# Patient Record
Sex: Male | Born: 2005 | Race: White | Hispanic: No | Marital: Single | Smoking: Never smoker
Health system: Southern US, Community
[De-identification: ages and names within clinical notes are randomized; demographics above are authoritative.]

---
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
## 2023-10-15 IMAGING — DX DG SCOLIOSIS EVAL COMPLETE SPINE 2-3V
2 series · 8 of 8 positions shown · non-contrast
Comparison: None.

CLINICAL DATA: Lordosis of lumbar region.

EXAM:
DG SCOLIOSIS EVAL COMPLETE SPINE 2-3V

[Series 1: whole body ap · 0.14mm/px · 4 of 4 slices shown]
[im 1/4]
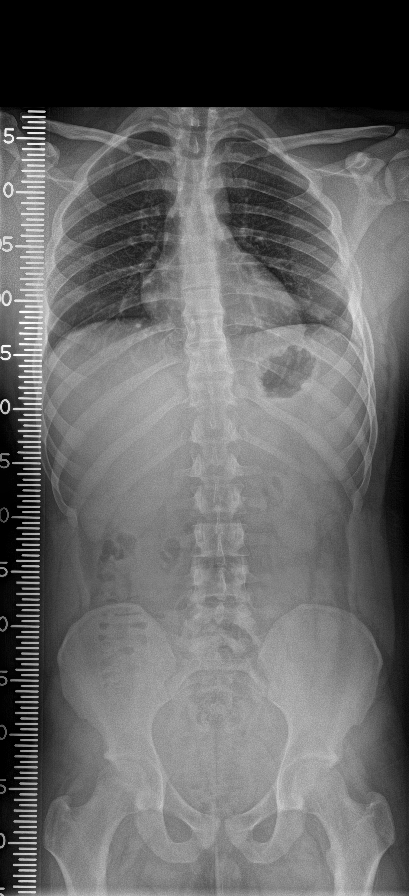
[im 2/4]
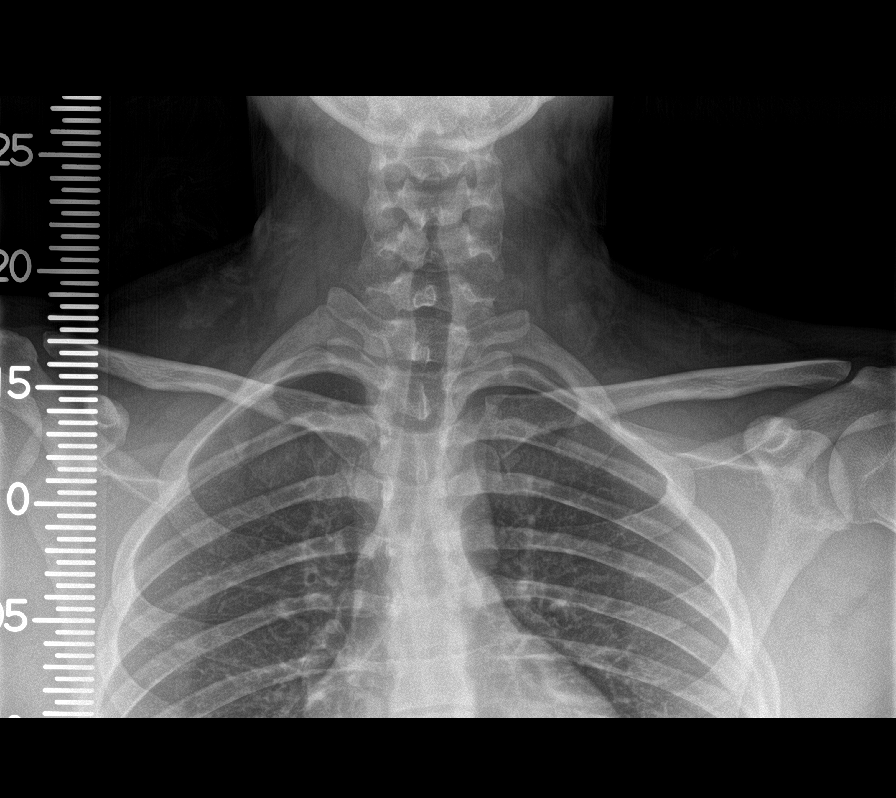
[im 3/4]
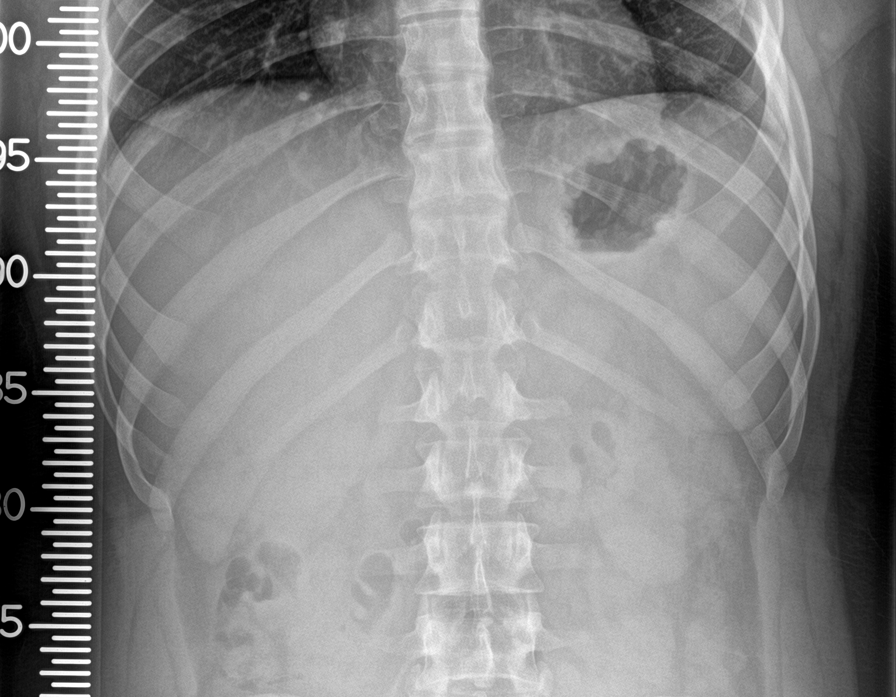
[im 4/4]
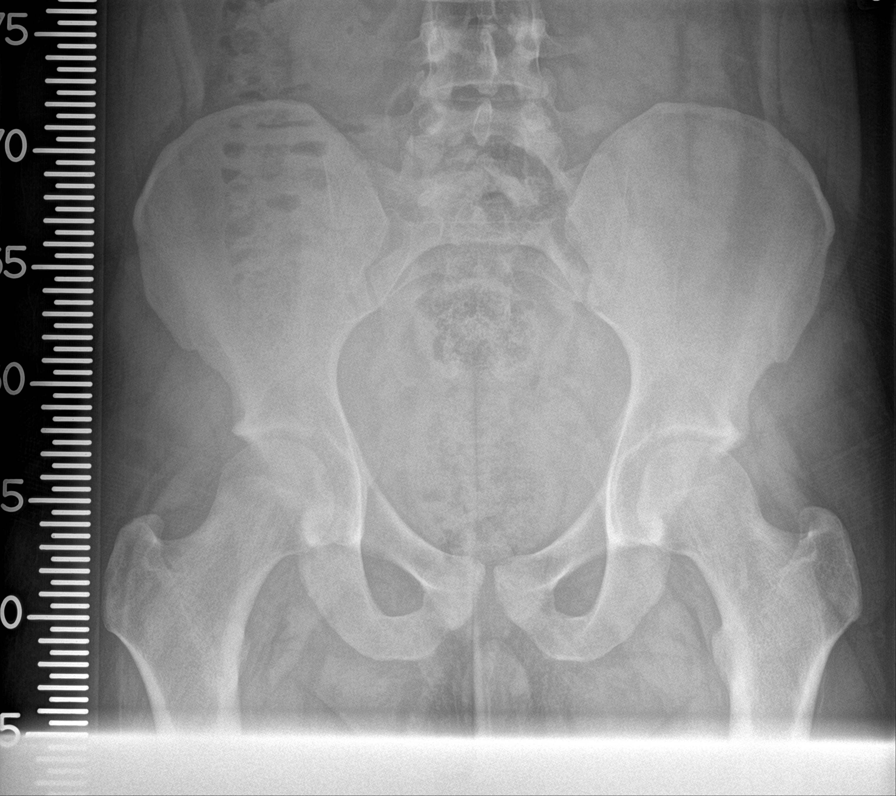

[Series 2: whole body lat · 0.14mm/px · 4 of 4 slices shown]
[im 1/4]
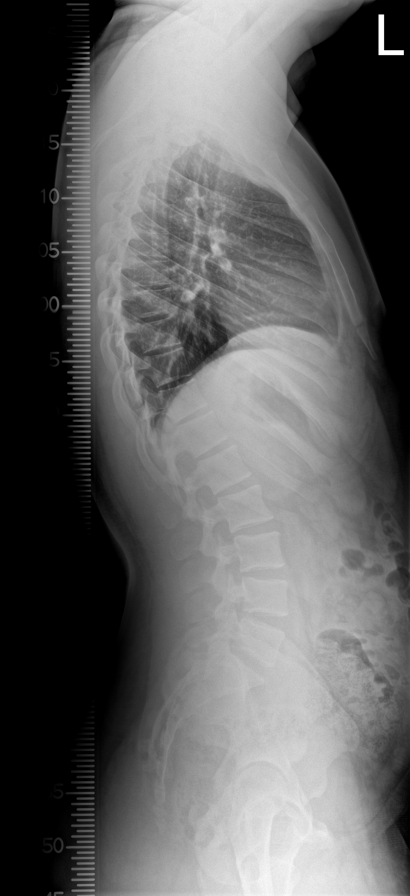
[im 2/4]
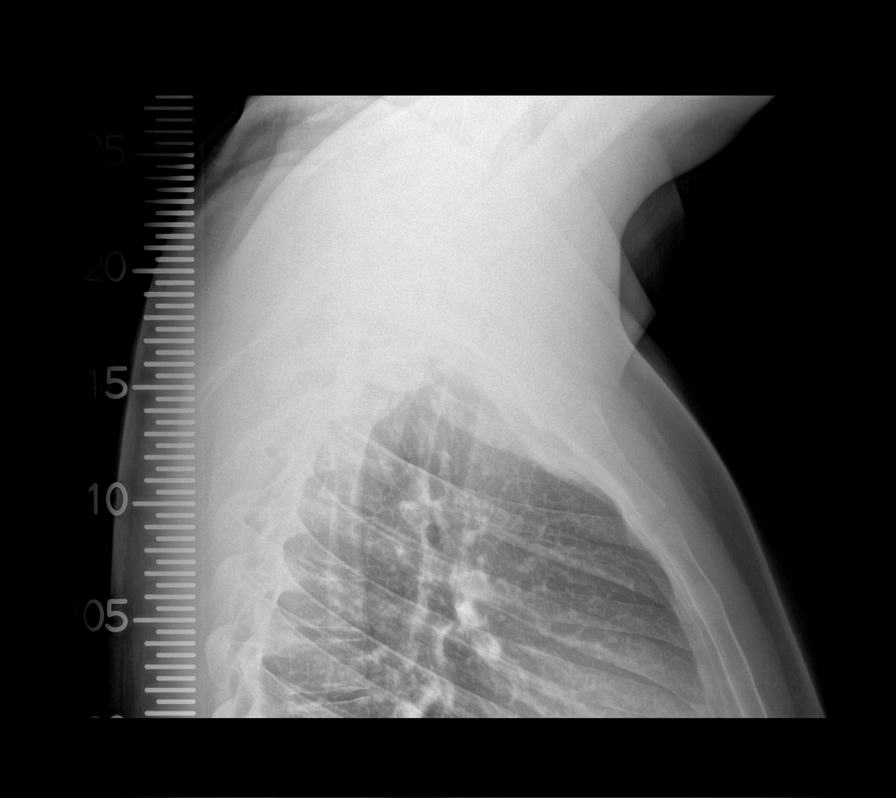
[im 3/4]
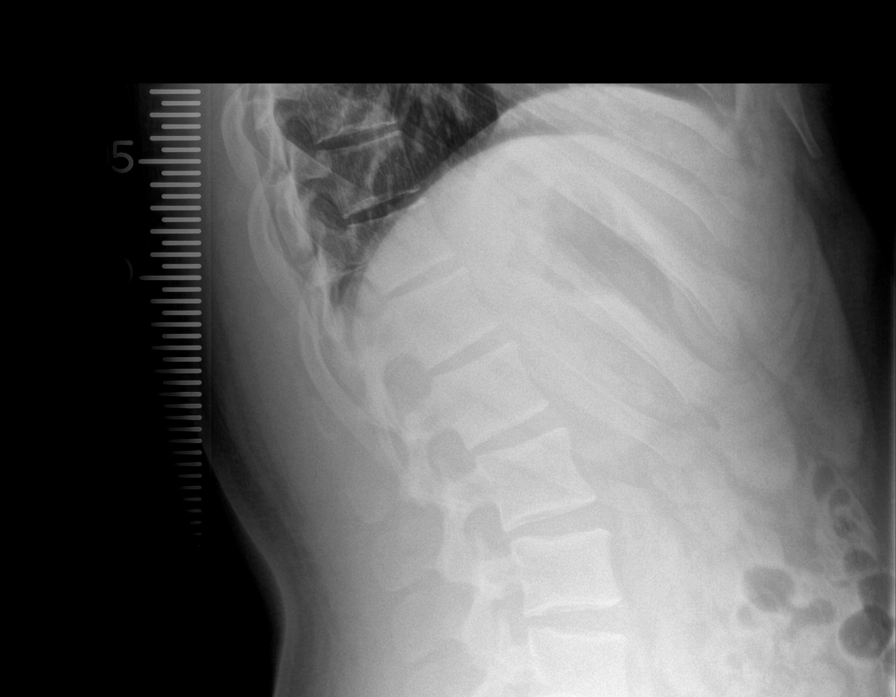
[im 4/4]
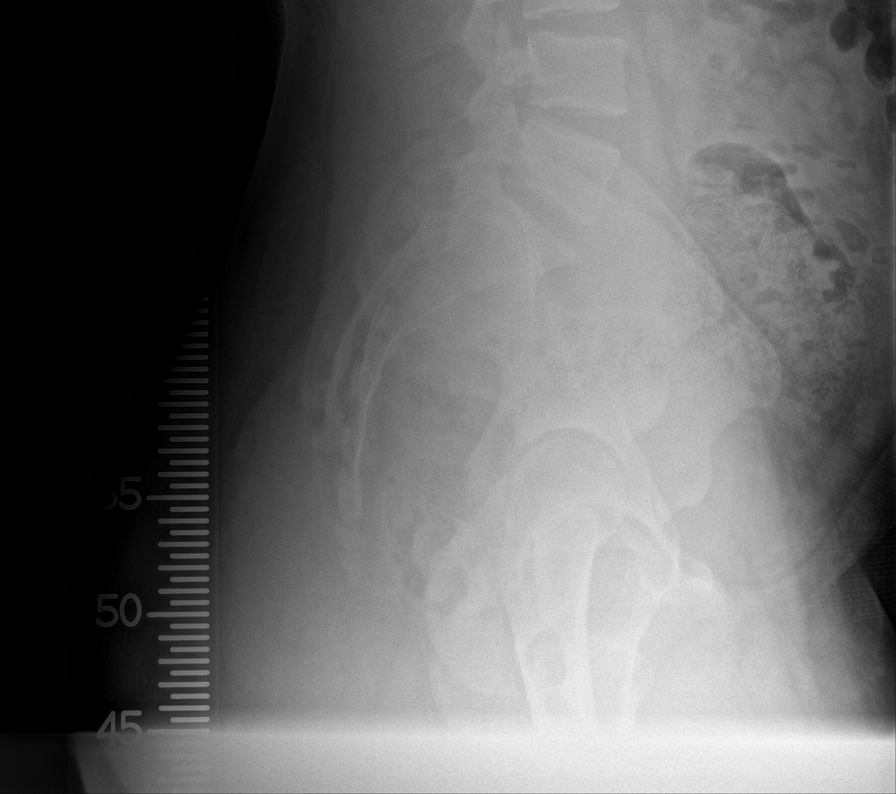

[8 of 8 positions shown; findings below may reference images not displayed]

FINDINGS: Slight convex rightward scoliosis centered in the upper thoracic
spine measuring 8 degrees. No lumbar curvature. No acute or
congenital bony anomaly. Normal lumbar lordosis on the lateral view.
Lungs clear. Heart is normal size.
IMPRESSION: Slight convex rightward scoliosis centered in the upper thoracic
spine.
